# Patient Record
Sex: Female | Born: 1975 | Race: White | Hispanic: No | Marital: Married | Smoking: Former smoker
Health system: Southern US, Community
[De-identification: ages and names within clinical notes are randomized; demographics above are authoritative.]

## PROBLEM LIST (undated history)

## (undated) DIAGNOSIS — J45909 Unspecified asthma, uncomplicated: Secondary | ICD-10-CM

## (undated) DIAGNOSIS — T7840XA Allergy, unspecified, initial encounter: Secondary | ICD-10-CM

## (undated) HISTORY — PX: OTHER SURGICAL HISTORY: SHX169

## (undated) HISTORY — DX: Unspecified asthma, uncomplicated: J45.909

## (undated) HISTORY — DX: Allergy, unspecified, initial encounter: T78.40XA

## (undated) HISTORY — PX: ENDOMETRIAL BIOPSY: SHX622

## (undated) HISTORY — PX: CHOLECYSTECTOMY: SHX55

## (undated) HISTORY — PX: TONSILLECTOMY AND ADENOIDECTOMY: SUR1326

## (undated) HISTORY — PX: BREAST BIOPSY: SHX20

---

## 1998-05-21 ENCOUNTER — Encounter: Admission: RE | Admit: 1998-05-21 | Discharge: 1998-06-26 | Payer: Self-pay | Admitting: Specialist

## 1998-08-19 ENCOUNTER — Emergency Department (HOSPITAL_COMMUNITY): Admission: EM | Admit: 1998-08-19 | Discharge: 1998-08-19 | Payer: Self-pay | Admitting: Emergency Medicine

## 1998-08-20 ENCOUNTER — Encounter: Payer: Self-pay | Admitting: Emergency Medicine

## 1999-05-02 ENCOUNTER — Other Ambulatory Visit: Admission: RE | Admit: 1999-05-02 | Discharge: 1999-05-02 | Payer: Self-pay | Admitting: *Deleted

## 2000-04-21 ENCOUNTER — Ambulatory Visit (HOSPITAL_COMMUNITY): Admission: RE | Admit: 2000-04-21 | Discharge: 2000-04-21 | Payer: Self-pay | Admitting: Gastroenterology

## 2000-04-27 ENCOUNTER — Ambulatory Visit (HOSPITAL_COMMUNITY): Admission: RE | Admit: 2000-04-27 | Discharge: 2000-04-27 | Payer: Self-pay | Admitting: Gastroenterology

## 2000-04-27 ENCOUNTER — Encounter: Payer: Self-pay | Admitting: Gastroenterology

## 2000-05-11 ENCOUNTER — Other Ambulatory Visit: Admission: RE | Admit: 2000-05-11 | Discharge: 2000-05-11 | Payer: Self-pay | Admitting: Obstetrics and Gynecology

## 2000-05-27 ENCOUNTER — Encounter (INDEPENDENT_AMBULATORY_CARE_PROVIDER_SITE_OTHER): Payer: Self-pay | Admitting: Specialist

## 2000-05-27 ENCOUNTER — Encounter: Payer: Self-pay | Admitting: General Surgery

## 2000-05-27 ENCOUNTER — Observation Stay (HOSPITAL_COMMUNITY): Admission: RE | Admit: 2000-05-27 | Discharge: 2000-05-27 | Payer: Self-pay | Admitting: General Surgery

## 2003-08-06 ENCOUNTER — Other Ambulatory Visit: Admission: RE | Admit: 2003-08-06 | Discharge: 2003-08-06 | Payer: Self-pay | Admitting: Obstetrics and Gynecology

## 2004-10-17 ENCOUNTER — Other Ambulatory Visit: Admission: RE | Admit: 2004-10-17 | Discharge: 2004-10-17 | Payer: Self-pay | Admitting: Obstetrics and Gynecology

## 2005-10-06 ENCOUNTER — Ambulatory Visit (HOSPITAL_COMMUNITY): Admission: RE | Admit: 2005-10-06 | Discharge: 2005-10-06 | Payer: Self-pay | Admitting: Obstetrics and Gynecology

## 2006-03-09 ENCOUNTER — Inpatient Hospital Stay (HOSPITAL_COMMUNITY): Admission: AD | Admit: 2006-03-09 | Discharge: 2006-03-11 | Payer: Self-pay | Admitting: Obstetrics and Gynecology

## 2006-03-21 ENCOUNTER — Inpatient Hospital Stay (HOSPITAL_COMMUNITY): Admission: AD | Admit: 2006-03-21 | Discharge: 2006-03-21 | Payer: Self-pay | Admitting: Obstetrics and Gynecology

## 2006-04-23 ENCOUNTER — Other Ambulatory Visit: Admission: RE | Admit: 2006-04-23 | Discharge: 2006-04-23 | Payer: Self-pay | Admitting: Obstetrics and Gynecology

## 2010-05-20 ENCOUNTER — Encounter
Admission: RE | Admit: 2010-05-20 | Discharge: 2010-05-20 | Payer: Self-pay | Source: Home / Self Care | Attending: Family Medicine | Admitting: Family Medicine

## 2010-06-14 ENCOUNTER — Encounter: Payer: Self-pay | Admitting: Family Medicine

## 2010-06-15 ENCOUNTER — Encounter: Payer: Self-pay | Admitting: Obstetrics and Gynecology

## 2010-10-10 NOTE — Op Note (Signed)
[facility]  Patient:    Denise Hampton, Denise Hampton                     MRN: [id number] Proc. Date: 05/27/00 Adm. Date:  [id number] Attending:  Arlis Porta CC:         Teena Irani. Arlyce Dice, M.D.  Anselmo Rod, M.D.   Operative Report  PREOPERATIVE DIAGNOSIS:  Symptomatic cholelithiasis.  POSTOPERATIVE DIAGNOSIS:  Chronic calculus cholecystitis.  PROCEDURE:  Laparoscopic cholecystectomy with intraoperative cholangiogram.  SURGEON:  Dr. Abbey Chatters.  ASSISTANT:  Dr. Derrell Lolling.  ANESTHESIA:  General.  INDICATIONS FOR PROCEDURE:  Denise Hampton is a 35 year old female who has had some mid epigastric pain that was felt to be heartburn. She had been started on proton pump inhibitors, but she still has the pain. It is described as pressure or cramping type. She underwent an upper endoscopy which showed a small hiatal hernia but was otherwise unremarkable. Ultrasound showed tiny crystals, small gross gallstones. The hepatobiliary scan was normal. She now presents for cholecystectomy.  TECHNIQUE:  She was placed supine on the operating table and a general anesthetic was administered. The abdomen was sterilely prepped and draped. A subumbilical incision was made incising the skin sharply. The subcutaneous tissue was dissected bluntly down to the midline fascia and a 1.5 cm incision was made in the midline fascia. The peritoneal cavity was then entered bluntly and under direct vision. A pursestring suture of #0 Vicryl was placed around the fascial edges. A Hasson trocar was introduced into the peritoneal cavity and a pneumoperitoneum created by insufflation of CO2 gas.  The patient was then placed in the appropriate position. An 11 mm incision was made in the epigastrium and a similar size trocar inserted into the peritoneal cavity through this. Two 5 mm incisions were made in the right abdomen and similar size trocars placed in these. A fundus was grasped and  adhesions between the omentum and duodenum and body of the gallbladder were lysed sharply. The infundibulum was grasped and retracted laterally. Using careful blunt dissection and dissecting on the gallbladder, the junction of the cystic duct and gallbladder was identified and isolated. The infundibulum was then completely mobilized. The cystic duct was then clipped at its junction with the gallbladder and an incision made in the cystic duct. A cholangiocath was passed through the anterior abdominal wall and into the cystic duct. A cholangiogram was subsequently performed.  Under real-time fluoroscopy, half strength contrast material was injected into the cystic duct. It probably went into the common bile duct and into the duodenum without evidence of obstruction.  The cholangiocatheter was removed, the cystic duct was clipped 3 times proximally and then divided sharply. The cystic artery was identified, clipped and divided. The gallbladder was dissected free from the liver bed using electrocautery. Bleeding points in the liver bed were controlled with the cautery. The liver bed was irrigated and examined and no evidence of bleeding or bile leakage. The gallbladder was removed through the subumbilical port and the fascial defect closed by tightening up and tying down the pursestring suture. Once again the gallbladder fossa was examined and no evidence of bile leak or bleeding was noted. Irrigation fluid was evacuated. The trocars were removed and a pneumoperitoneum released.  The skin incisions were then closed with 4-0 monocryl subcuticular stitches followed by Steri-Strips and sterile dressings.  The patient tolerated the procedure well without any apparent complications and was taken to the recovery room in satisfactory condition. DD:  05/27/00 TD:  05/27/00 Job: [id number]

## 2010-10-10 NOTE — H&P (Signed)
Denise Hampton, Denise Hampton NO.:  [id number]   MEDICAL RECORD NO.:  [id number]          PATIENT TYPE:  INP   LOCATION:  9161                          FACILITY:  [facility]   PHYSICIAN:  Osborn Coho, M.D.   DATE OF BIRTH:  25-Jan-1976   DATE OF ADMISSION:  03/09/2006  DATE OF DISCHARGE:                                HISTORY & PHYSICAL   [name] is a 35 year old gravida 2, para 1-0-0-1 who was admitted at 47-  4/[redacted] weeks gestation for induction of labor secondary to mild gestational  hypertension without evidence of preeclampsia.  Patient reports occasional  mild headache but denies vision changes, right upper quadrant pain.  Patient  with mild lower extremity edema, which she reports has not changed.  Patient's blood pressure was noted to be elevated approximately one week ago  with negative PIH labs on March 05, 2006.  Patient had declined proceeding  with induction prior to today.  Patient reports that her fetus has been  moving normally.  Patient reports occasional uterine contractions.  Patient  denies ruptured membranes or bleeding.  Patient's pregnancy is remarkable  for 1) History of asthma.  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 3) First trimester x-ray  exposure, 4) History of alcoholism in the past.  5)  Father of the baby with  Wolff-Parkinson-White.  6) Fetal echogenic intracardiac foci and __________  with mild mitral regurgitation on fetal echo.   PRENATAL LABS:  Initial hemoglobin 13.5, hematocrit 41.1, platelets 279,000,  blood type O-positive, antibody screen negative, RPR nonreactive, Rubella  titer immune, hepatitis B surface antigen negative.  HIV was declined.  Gonorrhea and chlamydia cultures negative x2, cystic fibrosis negative.  First trimester screen was within normal limits.  AST only at 16 weeks,  negative.  One-hour Glucola at 28 weeks, 155, RPR nonreactive.  Three-hour  Glucola with one elevated value.  Group B strep is negative.  Third  trimester  Gonorrhea and Chlamydia cultures declined.   HISTORY OF PRESENT PREGNANCY:  The patient entered care at [redacted] weeks  gestation.  Patient's last menstrual period May 29, 2005 with [facility] of  March 05, 2006.  EDC confirmed by ultrasound at [redacted] weeks gestation at that  time.  A twin IUP was noted with 1 missed AB with fetal pole consistent with  5 weeks noted.  Patient underwent first trimester screen at [facility] with normal results.  Patient underwent AFP only at 16 weeks,  which was negative.  Patient underwent fetal echo at 18 weeks, at which time  the fetus was noted to have an echogenic intracardiac foci, as well as mild  mitral regurgitation.  At that time, it was recommended that patient have  follow-up echo at 32 weeks.  Patient with elevated blood pressure of 150/80  at 30 weeks, with trace edema.  However, repeat blood pressure was 110/68.  Patient with no toxic signs or symptoms at that time.  Patient with  Glycosuria urea at 30 weeks.  Patient with a dextra-stick of 103.  At that  time, however, patient was not fasting, as she had worked  that night.  Patient continued to be noted to have Glycosuria at 31 weeks, with a dextra-  stick of 142.  The one abnormal value on 3-hour Glucola was discussed at  that time.  At that time, the plan was made to do fasting blood sugars and 2-  hour PC blood sugars at 32 and 34 and 36 weeks.  However, patient works  night shift and frequently comes to appointments immediately after  completing shift and is unable to wait 2 hours to do postprandial blood  sugars.  At that time, plan was made to check fasting blood sugars at the  time of her visits.  Patient underwent fetal echo at 33 weeks, and patient  continued with blood sugar of 87-90 at 33-35 weeks.  Patient with complaints  of right ear pain, congestion; however, no otitis was noted at that time.  Patient using over-the-counter medications with relief of symptoms.  At 36  weeks,  patient declined any further blood sugars, as they had been within  normal limits.  At 38 weeks, membranes were swept, cervix 2 cm.  Thirty-nine  weeks, blood pressure noted to be 140/90 with no protein noted.  Trace edema  continued to be present; however, patient with mild headaches but no other  toxic signs and symptoms.  Repeat blood pressure 116/72.  Patient also with  slight nausea at that time, as well as heartburn.  Patient planned on using  Phenergan, which she already had a prescription for, for nausea.  Covered  measures for heartburn noted.  At 40 weeks, patient was noted to have a  blood pressure of 140/94, with a recheck of 130/72.  PIH labs were within  normal limits and negative urine protein at that time.  Patient was taken in  to work, and at that time patient agreed to proceed with induction of labor  on the day of admission.   OBSTETRIC HISTORY:  1. Pregnancy #1, April 2004, spontaneous vaginal delivery, female infant, 8      pounds, 9 ounces at [redacted] weeks gestation.  Patient with no complications      with that birth.  2. Pregnancy #2 is current.   GYNECOLOGICAL HISTORY:  Patient reports menarche at age 60 with regular  monthly menses, with moderate dysmenorrhea.  Patient denies history of  abnormal Paps or STDs.  Patient with no other gynecologic history.  Patient  with no history of contraceptive use.  Patient with a history of frequent  yeast infections in the past.   PAST MEDICAL HISTORY:  Significant for asthma.  Patient uses albuterol on an  irregular basis.  Patient also with a history of chronic constipation and  gastroesophageal reflux disease, for which she does not take any medications  on a regular basis.   FAMILY HISTORY:  Maternal grandfather:  Coronary artery disease.  Mother:  Chronic hypertension, depression, history of alcohol abuse.  Father:  Lung cancer.  Paternal grandmother:  Ovarian cancer.  Brother: Bipolar disorder.  Brothers:  A history of  drug use, as well as alcohol abuse, nicotine abuse.   PAST SURGICAL HISTORY:  Patient with a right knee arthroscopy 1999,  cholecystectomy 2002, tonsillectomy 1995.   GENETIC HISTORY:  Father of the baby with Wolff-Parkinson-White, had  underwent open heart surgery at age 58.  Patient's maternal grandmother with  twins, father of the baby's aunt with mental retardation, otherwise genetic  history is negative.   CURRENT MEDICATIONS:  Prenatal vitamins.   ALLERGIES:  NO KNOWN  DRUG ALLERGIES.  PATIENT IS ALLERGIC TO KIWI FRUIT  WHICH CAUSES SWELLING.   SOCIAL HISTORY:  Patient is married to the father of the baby, Denise Hampton,  who is involved and supportive.  Patient with 18 years of education and  works as a Designer, jewellery.  Father of the baby with 14 years of education  and is self employed.  Patient and her husband are [employer] in their  faith.  Patient denies current use of alcohol, tobacco and street drugs;  however, patient does have a history of tobacco use in the past and has a  history of alcohol abuse in the past.   REVIEW OF SYSTEMS:  Typical of one at-term pregnancy.   PHYSICAL EXAMINATION:  VITAL SIGNS:  Patient is afebrile, blood pressure  137/89, 150/91, other vital signs are stable.  HEENT:  Within normal limits.  LUNGS:  Clear.  HEART:  Regular rate and rhythm.  BREASTS:  Soft.  ABDOMEN:  Soft, gravid and nontender with fundal height extending 40 cm  above the symphysis pubis.  Fetal heart rate is baseline 140s with  accelerations present and variability present.  No decelerations are noted.  No uterine contractions are noted on uterine monitoring.  Digital exam of  the cervix finds cervix to be 2-3 cm dilated, 70% effaced, -3 station and  vertex presentation confirmed by  bedside ultrasound.  EXTREMITIES:  Trace edema noted.  Negative Homan's bilaterally.  Deep tendon  reflexes are 2+, no clonus.   ADMISSION LABS:  Hemoglobin 12.8, platelets 217,000,  SGOT 23, SGPT 15, uric  acid 4.2, LDH 171, all within normal limits.  Urine protein is negative.   ASSESSMENT:  1. Intrauterine pregnancy at 40-4/7ths' week gestation.  2. Pregnancy-induced hypertension, without evidence of preeclampsia.   PLAN:  1. Patient to be admitted to birthing suite for a consult with Dr. Osborn Coho.  2. Routine CNM orders.  3. Pitocin per low-dose protocol and AROM as fetal head descends.  Blood      pressures will be followed closely.  Patient desires a non-interventive      birth if possible.      Denise Hampton, CNM      Osborn Coho, M.D.  Electronically Signed    NOS/MEDQ  D:  03/09/2006  T:  03/09/2006  Job:  [id number]

## 2012-01-11 ENCOUNTER — Telehealth: Payer: Self-pay | Admitting: Obstetrics and Gynecology

## 2012-01-14 ENCOUNTER — Ambulatory Visit (INDEPENDENT_AMBULATORY_CARE_PROVIDER_SITE_OTHER): Payer: BC Managed Care – PPO | Admitting: Obstetrics and Gynecology

## 2012-01-14 ENCOUNTER — Encounter: Payer: Self-pay | Admitting: Obstetrics and Gynecology

## 2012-01-14 VITALS — BP 118/78 | Resp 16 | Wt 148.0 lb

## 2012-01-14 DIAGNOSIS — B3731 Acute candidiasis of vulva and vagina: Secondary | ICD-10-CM

## 2012-01-14 DIAGNOSIS — Z202 Contact with and (suspected) exposure to infections with a predominantly sexual mode of transmission: Secondary | ICD-10-CM

## 2012-01-14 DIAGNOSIS — B373 Candidiasis of vulva and vagina: Secondary | ICD-10-CM

## 2012-01-14 DIAGNOSIS — Z2089 Contact with and (suspected) exposure to other communicable diseases: Secondary | ICD-10-CM

## 2012-01-14 LAB — HEPATITIS C ANTIBODY: HCV Ab: NEGATIVE

## 2012-01-14 MED ORDER — TERCONAZOLE 0.4 % VA CREA: 1.0000 | TOPICAL_CREAM | Freq: Every day | VAGINAL | Status: AC

## 2012-01-14 NOTE — Progress Notes (Signed)
Vaginal Discharge/Discomfort/Itching  Subjective:   JAYDEEN DARLEY is an 36 y.o. woman who presents c/o vaginal itching last several days.  Started after laser hair removal, but has had that before without difficulty.  Treated self with Diflucan and OTC antifungal, with some improvement but no complete resolution.  Also requests HSV titers, Hep C, RPR, GC, chlamydia--in menage a trois with husband and another female partner since early this year. Declines HIV and HbSAG.  Objective: Perineum clear of lesions--area of mons is still mildly erythematous from laser hair treatment.   Vaginal discharge: color Cloudy, consistency pasty / cheesy, pH 5, wet mount hyphae and spores Vaginal lesions:  Erythema of vault noted, but no inflammation of cervix  Wet prep: hyphae and spores OSOM BV: not done OSOM Trichomonas:  not done  Assessment: Yeast vaginitis Wants STD testing  Plan: Additional Tests:GC and Chlamydia genprobes and RPR, Hep C, HSV titers  Medications: Terazol 7 cream to pharmacy Counseling:  diagnosis, lab results, follow up plan and will f/u with patient regarding test results.   Follow-up: Patient wants pap in September with HPV testing, due to new sexual behaviors.  Nigel Bridgeman 01/14/2012 10:37 AM

## 2012-01-14 NOTE — Progress Notes (Signed)
Color: white Odor: no Itching:yes Thin:no Thick:yes Fever:no Dyspareunia:no Hx PID:no HX STD:no Pelvic Pain:no Desires Gc/CT:yes Desires HIV,RPR,HbsAG:yes - Consent signed

## 2012-01-15 LAB — HSV 1 ANTIBODY, IGG: HSV 1 Glycoprotein G Ab, IgG: 0.19 IV

## 2012-01-15 LAB — HSV 2 ANTIBODY, IGG: HSV 2 Glycoprotein G Ab, IgG: 8.22 IV — ABNORMAL HIGH

## 2012-01-15 LAB — RPR

## 2012-01-15 LAB — GC/CHLAMYDIA PROBE AMP, GENITAL: Chlamydia, DNA Probe: NEGATIVE

## 2012-01-19 ENCOUNTER — Telehealth: Payer: Self-pay | Admitting: Obstetrics and Gynecology

## 2012-01-19 NOTE — Telephone Encounter (Signed)
TRIAGE/ TST RES °

## 2012-01-19 NOTE — Telephone Encounter (Signed)
LM for pt to c/b regarding her message. 

## 2012-01-19 NOTE — Telephone Encounter (Signed)
Didn't have everything yesterday. See me on Wednesday to review.

## 2012-01-20 ENCOUNTER — Telehealth: Payer: Self-pay | Admitting: Obstetrics and Gynecology

## 2012-01-20 DIAGNOSIS — R768 Other specified abnormal immunological findings in serum: Secondary | ICD-10-CM | POA: Insufficient documentation

## 2012-01-20 NOTE — Telephone Encounter (Signed)
Tyvonna/return call 

## 2012-01-20 NOTE — Telephone Encounter (Signed)
TC to patient to review STD labs done at visit 8/22.   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  Reviewed patient's hard chart--no previous HSV testing noted.  Results reviewed, questions answered. Patient will defer Valtrex unless outbreak occurs. To f/u prn.

## 2012-02-11 ENCOUNTER — Telehealth: Payer: Self-pay

## 2012-02-11 ENCOUNTER — Telehealth: Payer: Self-pay | Admitting: Obstetrics and Gynecology

## 2012-02-11 ENCOUNTER — Other Ambulatory Visit: Payer: Self-pay

## 2012-02-11 MED ORDER — TERCONAZOLE 0.4 % VA CREA: 1.0000 | TOPICAL_CREAM | Freq: Every day | VAGINAL | Status: AC

## 2012-02-11 NOTE — Telephone Encounter (Signed)
TC TO PT . SOMEONE STATED THAT SHE IS NOT AVAILBLE. WILL CALL BACK.

## 2012-02-11 NOTE — Telephone Encounter (Signed)
TC TO PT REGARDING MESSAGE. PT STATES THAT SHE WAS SEEN IN OUR  ON 01/14/12 BY VL AND VL GAVE PT TERAZOL 7 CREAM TO USE IN PT VAGINAL AREA AND PT STATES THAT THE CREAM WORKED WELL. PT IS REQUESTING ANOTHER RX FOR THE SAME CREAM FOR THE SAME SX SHE HAD IN AUG. PER HS SENT RX TO PT PHARMACY AND TOLD THE PT IF SX DO NOT GET ANY BETTER ,SHE NEED TO CALL FOR APPT TO BE EVAL. PT VOICED UNDERSTANDING.

## 2012-02-11 NOTE — Telephone Encounter (Signed)
TRIAGE/INFECTION

## 2012-02-12 ENCOUNTER — Ambulatory Visit: Payer: BC Managed Care – PPO | Admitting: Obstetrics and Gynecology

## 2012-03-30 ENCOUNTER — Encounter: Payer: Self-pay | Admitting: Obstetrics and Gynecology

## 2012-03-30 ENCOUNTER — Ambulatory Visit (INDEPENDENT_AMBULATORY_CARE_PROVIDER_SITE_OTHER): Payer: BC Managed Care – PPO | Admitting: Obstetrics and Gynecology

## 2012-03-30 VITALS — BP 116/80 | Ht 64.5 in | Wt 147.0 lb

## 2012-03-30 DIAGNOSIS — Z124 Encounter for screening for malignant neoplasm of cervix: Secondary | ICD-10-CM

## 2012-03-30 DIAGNOSIS — Z01419 Encounter for gynecological examination (general) (routine) without abnormal findings: Secondary | ICD-10-CM

## 2012-03-30 NOTE — Progress Notes (Signed)
The patient reports annual exam Contraception  Husband had vasectomy  Last mammogram: none Last pap: 02/02/2011 Normal  GC/Chlamydia cultures offered: declined HIV/RPR/HbsAg offered:  declined HSV 1 and 2 glycoprotein offered: declined  Menstrual cycle regular and monthly: Yes Menstrual flow normal: Yes some days have brown discharge   Urinary symptoms: stress incontinence  Normal bowel movements: Yes Reports abuse at home: No:  Subjective:    Denise Hampton is a 36 y.o. female, G2P2, who presents for an annual exam.     History   Social History  . Marital Status: Married    Spouse Name: N/A    Number of Children: N/A  . Years of Education: N/A   Social History Main Topics  . Smoking status: Never Smoker   . Smokeless tobacco: Never Used  . Alcohol Use: No  . Drug Use: No  . Sexually Active: Yes   Other Topics Concern  . None   Social History Narrative  . None    Menstrual cycle:   LMP: Patient's last menstrual period was 03/01/2012.           Cycle: occ spotting s/p ablation  The following portions of the patient's history were reviewed and updated as appropriate: allergies, current medications, past family history, past medical history, past social history, past surgical history and problem list.  Review of Systems Pertinent items are noted in HPI. Breast:Negative for breast lump,nipple discharge or nipple retraction Gastrointestinal: Negative for abdominal pain, change in bowel habits or rectal bleeding Urinary:negative   Objective:    BP 116/80  Ht 5' 4.5" (1.638 m)  Wt 147 lb (66.679 kg)  BMI 24.84 kg/m2  LMP 03/01/2012    Weight:  Wt Readings from Last 1 Encounters:  01/14/12 148 lb (67.132 kg)          BMI: Body mass index is 24.84 kg/(m^2).  General Appearance: Alert, appropriate appearance for age. No acute distress HEENT: Grossly normal Neck / Thyroid: Supple, no masses, nodes or enlargement Lungs: clear to auscultation bilaterally Back:  No CVA tenderness Breast Exam: No masses or nodes.No dimpling, nipple retraction or discharge. Cardiovascular: Regular rate and rhythm. S1, S2, no murmur Gastrointestinal: Soft, non-tender, no masses or organomegaly Pelvic Exam: Vulva and vagina appear normal. Bimanual exam reveals normal uterus and adnexa.Stress - urethrocele 3/4 Rectovaginal: not indicated Lymphatic Exam: Non-palpable nodes in neck, clavicular, axillary, or inguinal regions Skin: no rash or abnormalities Neurologic: Normal gait and speech, no tremor  Psychiatric: Alert and oriented, appropriate affect.     Assessment:    Normal gyn exam   USI Plan:    pap smear return annually or prn STD screening: declined Contraception:vasectomy Referral to [facility] with Harriett Sine for Genetic counseling   Silverio Lay MD

## 2012-03-31 LAB — PAP IG W/ RFLX HPV ASCU

## 2012-04-04 NOTE — Progress Notes (Signed)
Quick Note:  Please schedule Colposcopy with me ______ 

## 2013-07-13 ENCOUNTER — Ambulatory Visit (INDEPENDENT_AMBULATORY_CARE_PROVIDER_SITE_OTHER): Payer: BC Managed Care – PPO | Admitting: Family Medicine

## 2013-07-13 VITALS — BP 126/68 | HR 89 | Temp 99.9°F | Resp 20 | Ht 62.0 in | Wt 150.0 lb

## 2013-07-13 DIAGNOSIS — J45909 Unspecified asthma, uncomplicated: Secondary | ICD-10-CM | POA: Insufficient documentation

## 2013-07-13 DIAGNOSIS — J111 Influenza due to unidentified influenza virus with other respiratory manifestations: Secondary | ICD-10-CM

## 2013-07-13 MED ORDER — OSELTAMIVIR PHOSPHATE 75 MG PO CAPS: 75.0000 mg | ORAL_CAPSULE | Freq: Two times a day (BID) | ORAL | Status: AC

## 2013-07-13 MED ORDER — BENZONATATE 100 MG PO CAPS: 100.0000 mg | ORAL_CAPSULE | Freq: Three times a day (TID) | ORAL | Status: AC | PRN

## 2013-07-13 NOTE — Patient Instructions (Addendum)
Thank you for coming in today  I think that you have the flu Start tamiflu Return if worsening or persistent fever, respiratory problems beyond 48 hrs Try tessalon for cough Plenty of fluids Use albuterol every 4 hrs while you feel tight

## 2013-07-13 NOTE — Progress Notes (Signed)
   Subjective:    Patient ID: Denise Hampton, female    DOB: [birth year], 38 y.o.   MRN: [id number]  HPI Patient presents for flu like illness. Husband recently treated for the flu. Started feeling sick today. However, did have some eye pain yesterday. Today has pain with moving eyes but no headache. Now feeling worse and worse. Has cough, chest pain, feels like she has chest congestion but cannot cough it up. No body aches. Now having rhinorrhea. Had sinus infection and ear infection last month. 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 Has had persistent ear pressure since the end of January. Tmax at home 100.7. Took some tylenol for symptoms. No recent travel.  No vomiting, but poor appetite and nausea. Has increase asthma meds. Husband now much better after 2 days tamiflu.  Has history of asthma and allergic rhinitis.   Review of Systems  All other systems reviewed and are negative.       Objective:   Physical Exam  Constitutional: She is oriented to person, place, and time. She appears well-developed and well-nourished. No distress.  HENT:  Head: Normocephalic and atraumatic.  Right Ear: A middle ear effusion is present.  Left Ear:  No middle ear effusion.  Mouth/Throat: Oropharynx is clear and moist. No oropharyngeal exudate.  Eyes: Conjunctivae are normal. Pupils are equal, round, and reactive to light. No scleral icterus.  Neck: Normal range of motion. Neck supple.  Cardiovascular: Normal rate and regular rhythm.   No murmur heard. Pulmonary/Chest: Effort normal and breath sounds normal. No respiratory distress. She has no wheezes. She has no rales.  Musculoskeletal: Normal range of motion.  Lymphadenopathy:    She has no cervical adenopathy.  Neurological: She is alert and oriented to person, place, and time.  Skin: Skin is warm and dry. She is not diaphoretic.  Psychiatric: She has a normal mood and affect. Her behavior is normal.      Assessment & Plan:  #1. Flu -  Tamiflu - Return precautions: if not improving in 48 hrs or worsening respiratory symptoms return and consider CBC and CXR - Fluids - Tessalon pearles - F/u prn

## 2015-03-06 ENCOUNTER — Other Ambulatory Visit: Payer: Self-pay | Admitting: Family Medicine

## 2015-03-06 DIAGNOSIS — Z1231 Encounter for screening mammogram for malignant neoplasm of breast: Secondary | ICD-10-CM

## 2015-03-14 ENCOUNTER — Ambulatory Visit: Payer: Self-pay

## 2015-03-29 ENCOUNTER — Ambulatory Visit: Payer: Self-pay

## 2015-06-28 ENCOUNTER — Ambulatory Visit
Admission: RE | Admit: 2015-06-28 | Discharge: 2015-06-28 | Disposition: A | Discharge status: Home / Self Care | Payer: BC Managed Care – PPO | Source: Ambulatory Visit | Attending: Family Medicine | Admitting: Family Medicine

## 2015-06-28 ENCOUNTER — Ambulatory Visit: Payer: Self-pay

## 2015-06-28 DIAGNOSIS — Z1231 Encounter for screening mammogram for malignant neoplasm of breast: Secondary | ICD-10-CM

## 2016-09-14 ENCOUNTER — Other Ambulatory Visit: Payer: Self-pay | Admitting: Obstetrics and Gynecology

## 2016-09-14 DIAGNOSIS — Z1231 Encounter for screening mammogram for malignant neoplasm of breast: Secondary | ICD-10-CM

## 2016-09-30 ENCOUNTER — Other Ambulatory Visit: Payer: Self-pay | Admitting: Obstetrics and Gynecology

## 2016-09-30 ENCOUNTER — Ambulatory Visit
Admission: RE | Admit: 2016-09-30 | Discharge: 2016-09-30 | Disposition: A | Discharge status: Home / Self Care | Payer: BC Managed Care – PPO | Source: Ambulatory Visit | Attending: Obstetrics and Gynecology | Admitting: Obstetrics and Gynecology

## 2016-09-30 DIAGNOSIS — N6320 Unspecified lump in the left breast, unspecified quadrant: Secondary | ICD-10-CM

## 2016-09-30 DIAGNOSIS — Z1231 Encounter for screening mammogram for malignant neoplasm of breast: Secondary | ICD-10-CM

## 2017-03-05 ENCOUNTER — Ambulatory Visit (HOSPITAL_COMMUNITY)
Admission: EM | Admit: 2017-03-05 | Discharge: 2017-03-05 | Disposition: A | Discharge status: Home / Self Care | Payer: BC Managed Care – PPO | Attending: Family Medicine | Admitting: Family Medicine

## 2017-03-05 ENCOUNTER — Encounter (HOSPITAL_COMMUNITY): Payer: Self-pay | Admitting: Emergency Medicine

## 2017-03-05 DIAGNOSIS — S61217A Laceration without foreign body of left little finger without damage to nail, initial encounter: Secondary | ICD-10-CM | POA: Diagnosis not present

## 2017-03-05 NOTE — ED Triage Notes (Signed)
Pt reports laceration to left 5th digit onset 1515   Sts she was trying to open a window and her hand went through window  Last tetanus = unknown w/in 2 years.   A&O x4... NAD... Ambulatory

## 2017-03-05 NOTE — Discharge Instructions (Signed)
Return in 7-10 days for suture removal.

## 2017-03-09 NOTE — ED Provider Notes (Signed)
  [facility]   [id number] 03/05/17 Arrival Time: 1756  ASSESSMENT & PLAN:  1. Laceration of left little finger without foreign body without damage to nail, initial encounter    Procedure: Verbal consent obtained. Patient provided with risks and alternatives to the procedure. Wound copiously irrigated with NS then cleansed with betadine. Anesthetized with 4 mL of lidocaine without epinephrine. Wound carefully explored. No foreign body, tendon injury, or nonviable tissue were noted. Using sterile technique 7 interrupted 5-0 Ethilon sutures were placed to reapproximate the wound. Patient tolerated procedure well. No complications. Minimal bleeding. Patient advised to look for and return for any signs of infection such as redness, swelling, discharge, or worsening pain. Return for suture removal in 7-10 days.  Reviewed expectations re: course of current medical issues. Questions answered. Outlined signs and symptoms indicating need for more acute intervention. Patient verbalized understanding. After Visit Summary given.   SUBJECTIVE:  Denise Hampton is a 41 y.o. female who presents with a laceration of her L 5th finger today. Cut on a broken window pane. Moderate bleeding mostly controlled with pressure. Washed in sink at home before coming here.  Td UTD: Yes; approx 2 years ago  ROS: As per HPI.   OBJECTIVE:  Vitals:   03/05/17 1819  BP: (!) 170/94  Pulse: 76  Temp: 99.9 F (37.7 C)  TempSrc: Oral  SpO2: 96%    General appearance: alert; no distress Skin: laceration of her L 5th finger; size: approx 2 cm, curved; FROM of finger; normal capillary refill and sensation distally; tendons appear intact Psychological: alert and cooperative; normal mood and affect  Allergies  Allergen Reactions  . Kiwi Extract Anaphylaxis  . Mango Flavor Anaphylaxis  . Other     Environmental          Mardella Layman, MD 03/09/17 [phone]

## 2017-08-27 ENCOUNTER — Other Ambulatory Visit: Payer: Self-pay | Admitting: Internal Medicine

## 2017-08-27 DIAGNOSIS — Z1231 Encounter for screening mammogram for malignant neoplasm of breast: Secondary | ICD-10-CM

## 2017-10-08 ENCOUNTER — Ambulatory Visit
Admission: RE | Admit: 2017-10-08 | Discharge: 2017-10-08 | Disposition: A | Discharge status: Home / Self Care | Payer: BC Managed Care – PPO | Source: Ambulatory Visit | Attending: Internal Medicine | Admitting: Internal Medicine

## 2017-10-08 DIAGNOSIS — Z1231 Encounter for screening mammogram for malignant neoplasm of breast: Secondary | ICD-10-CM

## 2017-10-11 ENCOUNTER — Other Ambulatory Visit: Payer: Self-pay | Admitting: Internal Medicine

## 2017-10-11 DIAGNOSIS — R928 Other abnormal and inconclusive findings on diagnostic imaging of breast: Secondary | ICD-10-CM

## 2017-10-14 ENCOUNTER — Ambulatory Visit
Admission: RE | Admit: 2017-10-14 | Discharge: 2017-10-14 | Disposition: A | Discharge status: Home / Self Care | Payer: BC Managed Care – PPO | Source: Ambulatory Visit | Attending: Internal Medicine | Admitting: Internal Medicine

## 2017-10-14 ENCOUNTER — Other Ambulatory Visit: Payer: Self-pay | Admitting: Internal Medicine

## 2017-10-14 DIAGNOSIS — N6310 Unspecified lump in the right breast, unspecified quadrant: Secondary | ICD-10-CM

## 2017-10-14 DIAGNOSIS — R928 Other abnormal and inconclusive findings on diagnostic imaging of breast: Secondary | ICD-10-CM

## 2017-10-15 ENCOUNTER — Other Ambulatory Visit: Payer: Self-pay | Admitting: Internal Medicine

## 2017-10-15 ENCOUNTER — Ambulatory Visit
Admission: RE | Admit: 2017-10-15 | Discharge: 2017-10-15 | Disposition: A | Discharge status: Home / Self Care | Payer: BC Managed Care – PPO | Source: Ambulatory Visit | Attending: Internal Medicine | Admitting: Internal Medicine

## 2017-10-15 DIAGNOSIS — N6310 Unspecified lump in the right breast, unspecified quadrant: Secondary | ICD-10-CM

## 2018-06-08 ENCOUNTER — Telehealth: Payer: Self-pay | Admitting: *Deleted

## 2018-06-08 NOTE — Telephone Encounter (Signed)
REFERRAL SENT SCHEDULING AND NOTES ON FILE FROM [facility] [phone].

## 2018-11-18 ENCOUNTER — Other Ambulatory Visit: Payer: Self-pay | Admitting: Internal Medicine

## 2018-11-18 ENCOUNTER — Other Ambulatory Visit: Payer: Self-pay | Admitting: Cytopathology

## 2018-11-18 DIAGNOSIS — Z1231 Encounter for screening mammogram for malignant neoplasm of breast: Secondary | ICD-10-CM

## 2019-01-09 ENCOUNTER — Ambulatory Visit: Payer: BC Managed Care – PPO

## 2019-02-23 ENCOUNTER — Other Ambulatory Visit: Payer: Self-pay

## 2019-02-23 ENCOUNTER — Ambulatory Visit
Admission: RE | Admit: 2019-02-23 | Discharge: 2019-02-23 | Disposition: A | Discharge status: Home / Self Care | Payer: BC Managed Care – PPO | Source: Ambulatory Visit | Attending: Internal Medicine | Admitting: Internal Medicine

## 2019-02-23 DIAGNOSIS — Z1231 Encounter for screening mammogram for malignant neoplasm of breast: Secondary | ICD-10-CM

## 2019-02-24 ENCOUNTER — Other Ambulatory Visit: Payer: Self-pay | Admitting: Internal Medicine

## 2019-02-24 DIAGNOSIS — R928 Other abnormal and inconclusive findings on diagnostic imaging of breast: Secondary | ICD-10-CM

## 2019-02-27 ENCOUNTER — Ambulatory Visit
Admission: RE | Admit: 2019-02-27 | Discharge: 2019-02-27 | Disposition: A | Discharge status: Home / Self Care | Payer: BC Managed Care – PPO | Source: Ambulatory Visit | Attending: Internal Medicine | Admitting: Internal Medicine

## 2019-02-27 ENCOUNTER — Other Ambulatory Visit: Payer: Self-pay

## 2019-02-27 DIAGNOSIS — R928 Other abnormal and inconclusive findings on diagnostic imaging of breast: Secondary | ICD-10-CM

## 2020-08-02 ENCOUNTER — Other Ambulatory Visit: Payer: Self-pay | Admitting: Internal Medicine

## 2020-08-02 DIAGNOSIS — H9201 Otalgia, right ear: Secondary | ICD-10-CM

## 2020-08-02 DIAGNOSIS — R202 Paresthesia of skin: Secondary | ICD-10-CM

## 2020-08-03 ENCOUNTER — Other Ambulatory Visit: Payer: Self-pay

## 2020-08-03 ENCOUNTER — Ambulatory Visit (HOSPITAL_COMMUNITY)
Admission: RE | Admit: 2020-08-03 | Discharge: 2020-08-03 | Disposition: A | Discharge status: Home / Self Care | Payer: BC Managed Care – PPO | Source: Ambulatory Visit | Attending: Internal Medicine | Admitting: Internal Medicine

## 2020-08-03 DIAGNOSIS — H9201 Otalgia, right ear: Secondary | ICD-10-CM

## 2020-08-03 DIAGNOSIS — R202 Paresthesia of skin: Secondary | ICD-10-CM | POA: Diagnosis not present

## 2020-08-03 MED ORDER — GADOBUTROL 1 MMOL/ML IV SOLN
7.0000 mL | Freq: Once | INTRAVENOUS | Status: AC | PRN
  Administered 2020-08-03: 7 mL via INTRAVENOUS

## 2020-08-22 ENCOUNTER — Other Ambulatory Visit: Payer: Self-pay | Admitting: Internal Medicine

## 2020-08-22 DIAGNOSIS — Z1231 Encounter for screening mammogram for malignant neoplasm of breast: Secondary | ICD-10-CM

## 2020-10-15 ENCOUNTER — Other Ambulatory Visit: Payer: Self-pay

## 2020-10-15 ENCOUNTER — Ambulatory Visit
Admission: RE | Admit: 2020-10-15 | Discharge: 2020-10-15 | Disposition: A | Discharge status: Home / Self Care | Payer: BC Managed Care – PPO | Source: Ambulatory Visit | Attending: Internal Medicine | Admitting: Internal Medicine

## 2020-10-15 DIAGNOSIS — Z1231 Encounter for screening mammogram for malignant neoplasm of breast: Secondary | ICD-10-CM

## 2020-10-18 ENCOUNTER — Other Ambulatory Visit: Payer: Self-pay | Admitting: Internal Medicine

## 2020-10-18 DIAGNOSIS — R928 Other abnormal and inconclusive findings on diagnostic imaging of breast: Secondary | ICD-10-CM

## 2021-02-04 ENCOUNTER — Other Ambulatory Visit: Payer: Self-pay | Admitting: Internal Medicine

## 2021-02-04 DIAGNOSIS — E785 Hyperlipidemia, unspecified: Secondary | ICD-10-CM

## 2021-03-04 ENCOUNTER — Ambulatory Visit
Admission: RE | Admit: 2021-03-04 | Discharge: 2021-03-04 | Disposition: A | Discharge status: Home / Self Care | Payer: No Typology Code available for payment source | Source: Ambulatory Visit | Attending: Internal Medicine | Admitting: Internal Medicine

## 2021-03-04 DIAGNOSIS — E785 Hyperlipidemia, unspecified: Secondary | ICD-10-CM

## 2022-05-28 ENCOUNTER — Other Ambulatory Visit (HOSPITAL_BASED_OUTPATIENT_CLINIC_OR_DEPARTMENT_OTHER): Payer: Self-pay

## 2022-05-28 MED ORDER — SEMAGLUTIDE-WEIGHT MANAGEMENT 0.25 MG/0.5ML ~~LOC~~ SOAJ
0.2500 mg | SUBCUTANEOUS | 0 refills | Status: AC
  Filled 2022-05-28: qty 2, 28d supply, fill #0

## 2022-06-05 ENCOUNTER — Other Ambulatory Visit: Payer: Self-pay

## 2022-06-08 ENCOUNTER — Other Ambulatory Visit (HOSPITAL_BASED_OUTPATIENT_CLINIC_OR_DEPARTMENT_OTHER): Payer: Self-pay

## 2022-06-10 ENCOUNTER — Other Ambulatory Visit (HOSPITAL_BASED_OUTPATIENT_CLINIC_OR_DEPARTMENT_OTHER): Payer: Self-pay

## 2022-06-22 ENCOUNTER — Other Ambulatory Visit (HOSPITAL_BASED_OUTPATIENT_CLINIC_OR_DEPARTMENT_OTHER): Payer: Self-pay

## 2022-06-24 IMAGING — CT CT CARDIAC CORONARY ARTERY CALCIUM SCORE
3 series · 14 of 20 positions shown, 16 images · non-contrast
Comparison: None.

CLINICAL DATA: Hyperlipidemia

EXAM:
CT CARDIAC CORONARY ARTERY CALCIUM SCORE
TECHNIQUE: Non-contrast imaging through the heart was performed using
prospective ECG gating. Image post processing was performed on an
independent workstation, allowing for quantitative analysis of the
heart and coronary arteries. Note that this exam targets the heart
and the chest was not imaged in its entirety.

[Series 2: calcium scoring 2.00 qr36 bestdiast 69% hrt calciu · axial · 0.32mm/px · z∈[+1774,+1846]mm · 4 of 62 slices shown]
[im 13/62  vessel]
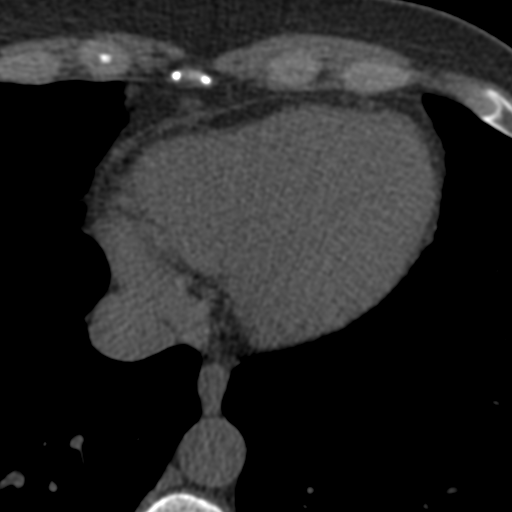
[im 25/62  vessel]
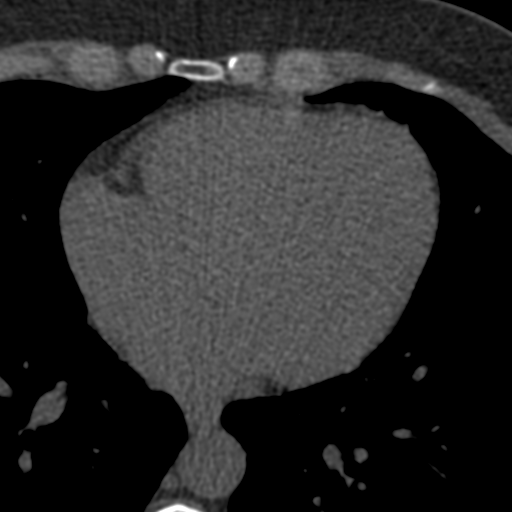
[im 37/62  vessel]
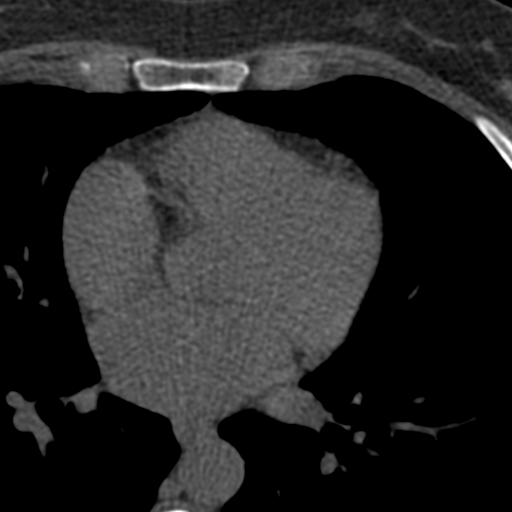
[im 49/62  vessel]
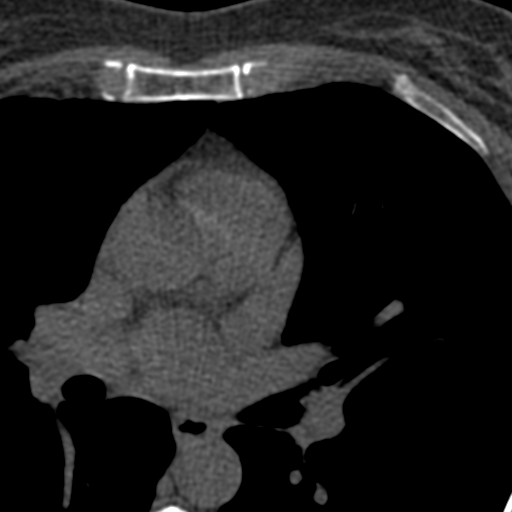

[Series 3: calcium scoring 2.00 br40 bestdiast 69% axial · axial · 0.62mm/px · z∈[+1770,+1850]mm · 5 of 62 slices shown, 7 images]
[im 11/62  vessel]
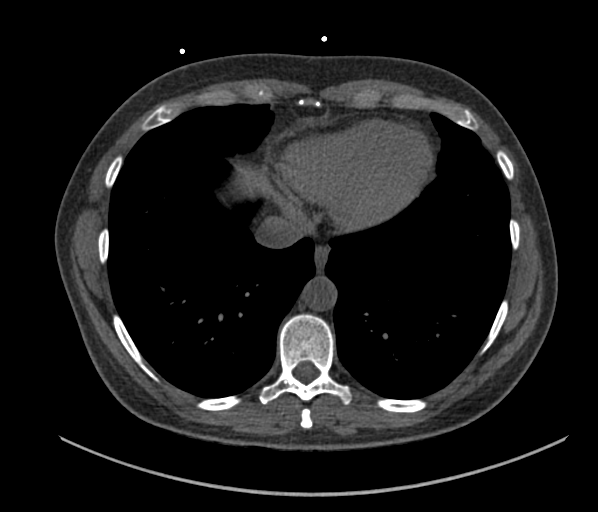
[im 11/62  lung]
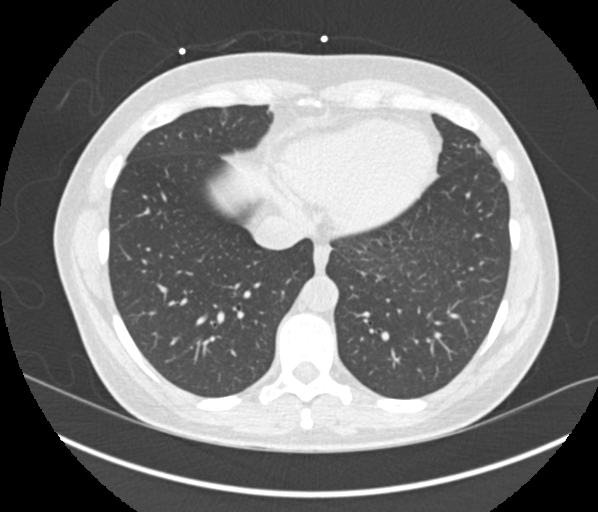
[im 21/62  vessel]
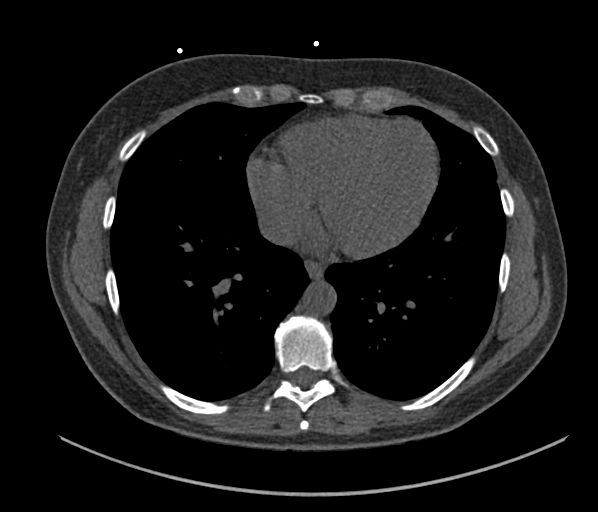
[im 31/62  vessel]
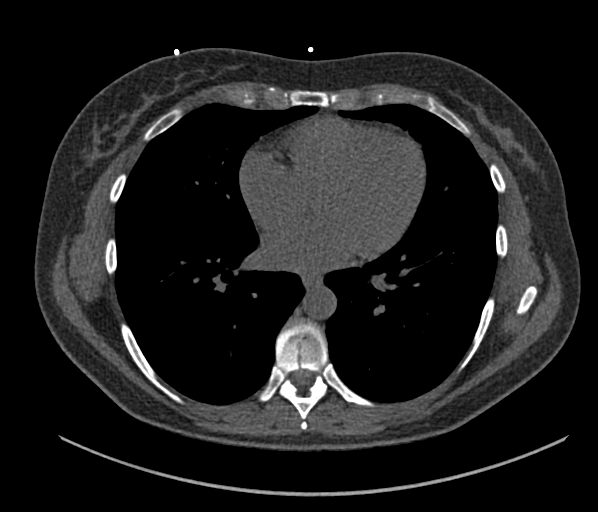
[im 41/62  vessel]
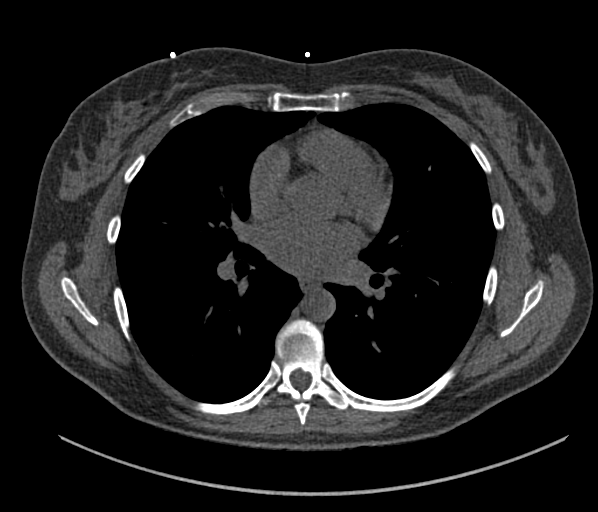
[im 51/62  vessel]
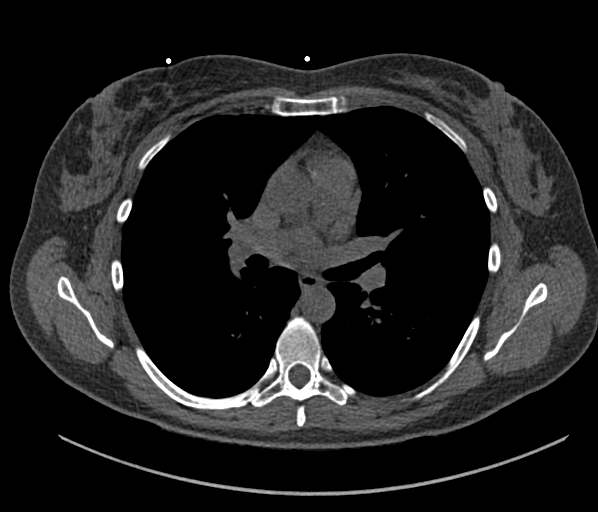
[im 51/62  lung]
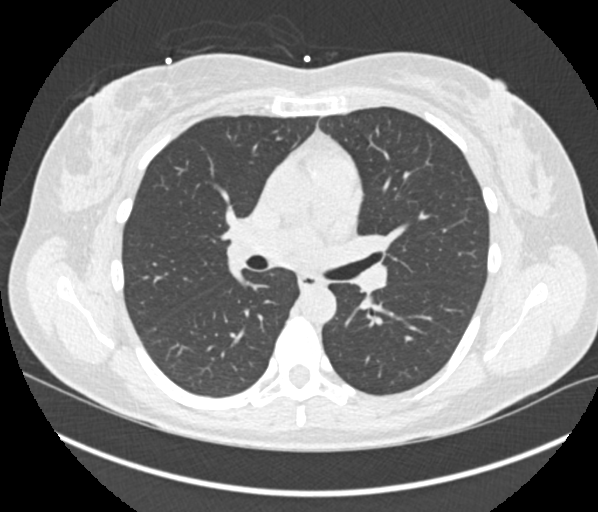

[Series 9: calcium scoring 2.00 br60 bestdiast 69% lungs · axial · 0.62mm/px · z∈[+1770,+1850]mm · 5 of 62 slices shown]
[im 11/62  vessel]
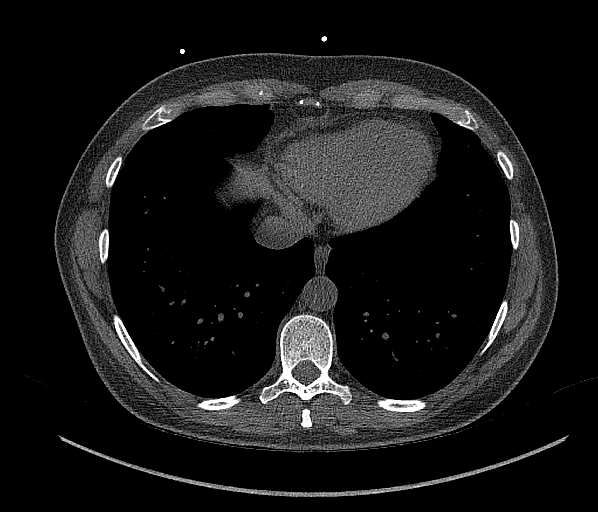
[im 21/62  vessel]
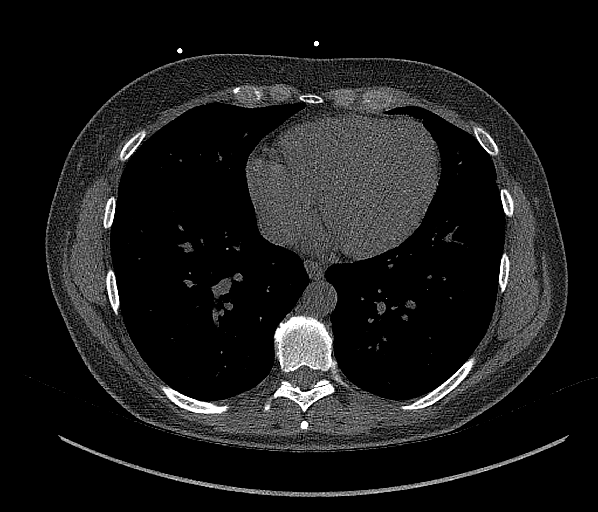
[im 31/62  vessel]
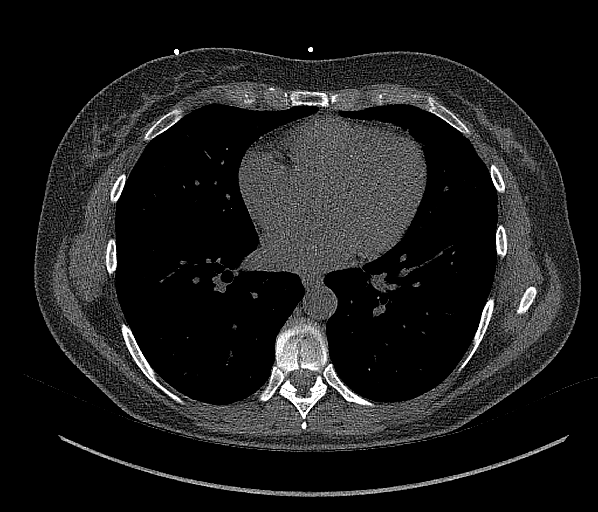
[im 41/62  vessel]
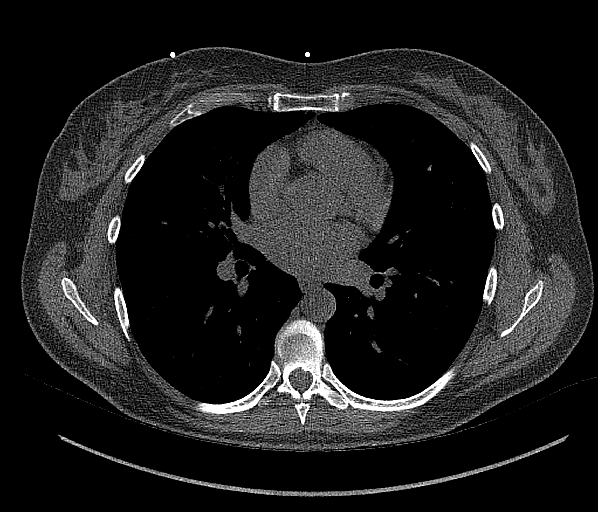
[im 51/62  vessel]
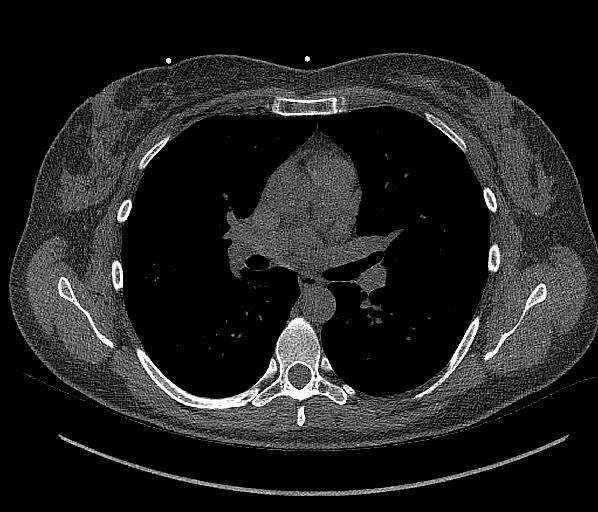

[14 of 20 positions shown; findings below may reference images not displayed]

FINDINGS: CORONARY CALCIUM SCORES:

Left Main: 0

LAD: 0

LCx: 0

RCA: 0

Total Agatston Score: 0

[HOSPITAL] percentile: 0

AORTA MEASUREMENTS:

Ascending Aorta: 29 mm

Descending Aorta: 23 mm

OTHER FINDINGS:

Heart is normal size. Aorta normal caliber. No adenopathy. No
confluent opacities or effusions. Imaging into the upper abdomen
demonstrates no acute findings. Chest wall soft tissues are
unremarkable. No acute bony abnormality.
IMPRESSION: No visible coronary artery calcifications. Total coronary calcium
score of 0.

No acute or significant extracardiac abnormality.

## 2022-10-14 ENCOUNTER — Other Ambulatory Visit (HOSPITAL_BASED_OUTPATIENT_CLINIC_OR_DEPARTMENT_OTHER): Payer: Self-pay

## 2022-10-14 ENCOUNTER — Other Ambulatory Visit: Payer: Self-pay

## 2022-10-14 MED ORDER — ZEPBOUND 2.5 MG/0.5ML ~~LOC~~ SOAJ
2.5000 mg | SUBCUTANEOUS | 0 refills | Status: AC
  Filled 2022-10-14: qty 2, 28d supply, fill #0

## 2022-10-14 MED ORDER — ZEPBOUND 5 MG/0.5ML ~~LOC~~ SOAJ
5.0000 mg | SUBCUTANEOUS | 5 refills | Status: AC
  Filled 2022-11-10: qty 2, 28d supply, fill #0
  Filled 2022-12-07: qty 2, 28d supply, fill #1

## 2022-11-10 ENCOUNTER — Other Ambulatory Visit (HOSPITAL_BASED_OUTPATIENT_CLINIC_OR_DEPARTMENT_OTHER): Payer: Self-pay

## 2022-12-07 ENCOUNTER — Other Ambulatory Visit (HOSPITAL_BASED_OUTPATIENT_CLINIC_OR_DEPARTMENT_OTHER): Payer: Self-pay

## 2022-12-28 ENCOUNTER — Other Ambulatory Visit (HOSPITAL_BASED_OUTPATIENT_CLINIC_OR_DEPARTMENT_OTHER): Payer: Self-pay

## 2022-12-28 MED ORDER — ZEPBOUND 7.5 MG/0.5ML ~~LOC~~ SOAJ
7.5000 mg | SUBCUTANEOUS | 3 refills | Status: AC
  Filled 2022-12-28: qty 2, 28d supply, fill #0

## 2022-12-30 ENCOUNTER — Other Ambulatory Visit (HOSPITAL_BASED_OUTPATIENT_CLINIC_OR_DEPARTMENT_OTHER): Payer: Self-pay

## 2023-01-22 ENCOUNTER — Encounter (HOSPITAL_BASED_OUTPATIENT_CLINIC_OR_DEPARTMENT_OTHER): Payer: Self-pay

## 2023-01-22 ENCOUNTER — Other Ambulatory Visit (HOSPITAL_BASED_OUTPATIENT_CLINIC_OR_DEPARTMENT_OTHER): Payer: Self-pay

## 2023-01-22 MED ORDER — ZEPBOUND 10 MG/0.5ML ~~LOC~~ SOAJ
10.0000 mg | SUBCUTANEOUS | 0 refills | Status: AC
  Filled 2023-01-22: qty 2, 28d supply, fill #0

## 2023-01-22 MED ORDER — ZEPBOUND 12.5 MG/0.5ML ~~LOC~~ SOAJ
12.5000 mg | SUBCUTANEOUS | 1 refills | Status: DC
  Filled 2023-02-23: qty 2, 28d supply, fill #0
  Filled 2023-03-23: qty 2, 28d supply, fill #1

## 2023-01-26 ENCOUNTER — Other Ambulatory Visit: Payer: Self-pay

## 2023-02-23 ENCOUNTER — Other Ambulatory Visit (HOSPITAL_BASED_OUTPATIENT_CLINIC_OR_DEPARTMENT_OTHER): Payer: Self-pay

## 2023-02-24 ENCOUNTER — Other Ambulatory Visit (HOSPITAL_BASED_OUTPATIENT_CLINIC_OR_DEPARTMENT_OTHER): Payer: Self-pay

## 2023-03-23 ENCOUNTER — Other Ambulatory Visit (HOSPITAL_BASED_OUTPATIENT_CLINIC_OR_DEPARTMENT_OTHER): Payer: Self-pay

## 2023-04-20 ENCOUNTER — Other Ambulatory Visit (HOSPITAL_BASED_OUTPATIENT_CLINIC_OR_DEPARTMENT_OTHER): Payer: Self-pay

## 2023-04-20 ENCOUNTER — Other Ambulatory Visit (HOSPITAL_COMMUNITY): Payer: Self-pay

## 2023-04-20 ENCOUNTER — Other Ambulatory Visit: Payer: Self-pay

## 2023-04-20 MED ORDER — ZEPBOUND 12.5 MG/0.5ML ~~LOC~~ SOAJ
12.5000 mg | SUBCUTANEOUS | 1 refills | Status: AC
  Filled 2023-04-20: qty 2, 28d supply, fill #0

## 2023-04-20 MED ORDER — ZEPBOUND 15 MG/0.5ML ~~LOC~~ SOAJ
15.0000 mg | SUBCUTANEOUS | 3 refills | Status: DC
  Filled 2023-05-22: qty 2, 28d supply, fill #0
  Filled 2023-06-22 (×2): qty 2, 28d supply, fill #1
  Filled 2023-07-15: qty 2, 28d supply, fill #2
  Filled 2023-08-25: qty 2, 28d supply, fill #3

## 2023-05-22 ENCOUNTER — Other Ambulatory Visit (HOSPITAL_BASED_OUTPATIENT_CLINIC_OR_DEPARTMENT_OTHER): Payer: Self-pay

## 2023-06-22 ENCOUNTER — Other Ambulatory Visit (HOSPITAL_BASED_OUTPATIENT_CLINIC_OR_DEPARTMENT_OTHER): Payer: Self-pay

## 2023-06-22 ENCOUNTER — Other Ambulatory Visit: Payer: Self-pay

## 2023-06-22 ENCOUNTER — Other Ambulatory Visit (HOSPITAL_COMMUNITY): Payer: Self-pay

## 2023-06-25 ENCOUNTER — Other Ambulatory Visit (HOSPITAL_COMMUNITY): Payer: Self-pay

## 2023-07-15 ENCOUNTER — Other Ambulatory Visit (HOSPITAL_BASED_OUTPATIENT_CLINIC_OR_DEPARTMENT_OTHER): Payer: Self-pay

## 2023-07-15 ENCOUNTER — Other Ambulatory Visit: Payer: Self-pay

## 2023-07-16 ENCOUNTER — Other Ambulatory Visit (HOSPITAL_BASED_OUTPATIENT_CLINIC_OR_DEPARTMENT_OTHER): Payer: Self-pay

## 2023-08-25 ENCOUNTER — Other Ambulatory Visit (HOSPITAL_BASED_OUTPATIENT_CLINIC_OR_DEPARTMENT_OTHER): Payer: Self-pay

## 2023-10-13 ENCOUNTER — Other Ambulatory Visit (HOSPITAL_BASED_OUTPATIENT_CLINIC_OR_DEPARTMENT_OTHER): Payer: Self-pay

## 2023-10-14 ENCOUNTER — Other Ambulatory Visit (HOSPITAL_BASED_OUTPATIENT_CLINIC_OR_DEPARTMENT_OTHER): Payer: Self-pay

## 2023-10-14 MED ORDER — ZEPBOUND 12.5 MG/0.5ML ~~LOC~~ SOAJ
12.5000 mg | SUBCUTANEOUS | 1 refills | Status: AC
  Filled 2023-10-14: qty 2, 28d supply, fill #0

## 2023-10-15 ENCOUNTER — Other Ambulatory Visit (HOSPITAL_BASED_OUTPATIENT_CLINIC_OR_DEPARTMENT_OTHER): Payer: Self-pay

## 2023-10-15 MED ORDER — ZEPBOUND 15 MG/0.5ML ~~LOC~~ SOAJ
15.0000 mg | SUBCUTANEOUS | 3 refills | Status: AC
  Filled 2023-10-15: qty 2, 28d supply, fill #0

## 2023-10-19 ENCOUNTER — Other Ambulatory Visit (HOSPITAL_BASED_OUTPATIENT_CLINIC_OR_DEPARTMENT_OTHER): Payer: Self-pay

## 2023-10-19 MED ORDER — ZEPBOUND 15 MG/0.5ML ~~LOC~~ SOAJ
15.0000 mg | SUBCUTANEOUS | 3 refills | Status: AC
  Filled 2023-11-23: qty 2, 28d supply, fill #0

## 2023-11-23 ENCOUNTER — Other Ambulatory Visit (HOSPITAL_BASED_OUTPATIENT_CLINIC_OR_DEPARTMENT_OTHER): Payer: Self-pay
